# Patient Record
Sex: Female | Born: 1935 | Race: White | Hispanic: No | Smoking: Current every day smoker
Health system: Southern US, Community
[De-identification: ages and names within clinical notes are randomized; demographics above are authoritative.]

## PROBLEM LIST (undated history)

## (undated) DIAGNOSIS — D649 Anemia, unspecified: Secondary | ICD-10-CM

## (undated) DIAGNOSIS — G20A1 Parkinson's disease without dyskinesia, without mention of fluctuations: Secondary | ICD-10-CM

## (undated) DIAGNOSIS — G20 Parkinson's disease: Secondary | ICD-10-CM

---

## 1959-09-17 HISTORY — PX: APPENDECTOMY: SHX54

## 1972-09-16 HISTORY — PX: ABDOMINAL HYSTERECTOMY: SHX81

## 2000-09-16 HISTORY — PX: EYE SURGERY: SHX253

## 2014-12-06 ENCOUNTER — Ambulatory Visit: Payer: Self-pay | Admitting: Family Medicine

## 2015-12-27 ENCOUNTER — Other Ambulatory Visit: Payer: Self-pay | Admitting: Family Medicine

## 2015-12-27 DIAGNOSIS — T8484XD Pain due to internal orthopedic prosthetic devices, implants and grafts, subsequent encounter: Secondary | ICD-10-CM

## 2015-12-27 DIAGNOSIS — M25551 Pain in right hip: Secondary | ICD-10-CM

## 2016-01-10 ENCOUNTER — Other Ambulatory Visit: Payer: Self-pay | Admitting: Family Medicine

## 2016-01-10 ENCOUNTER — Ambulatory Visit
Admission: RE | Admit: 2016-01-10 | Discharge: 2016-01-10 | Disposition: A | Discharge status: Home / Self Care | Payer: Medicare Other | Source: Ambulatory Visit | Attending: Family Medicine | Admitting: Family Medicine

## 2016-01-10 DIAGNOSIS — Z96641 Presence of right artificial hip joint: Secondary | ICD-10-CM | POA: Insufficient documentation

## 2016-01-10 DIAGNOSIS — T8484XD Pain due to internal orthopedic prosthetic devices, implants and grafts, subsequent encounter: Secondary | ICD-10-CM

## 2016-01-10 DIAGNOSIS — M1612 Unilateral primary osteoarthritis, left hip: Secondary | ICD-10-CM | POA: Insufficient documentation

## 2016-01-10 DIAGNOSIS — K5730 Diverticulosis of large intestine without perforation or abscess without bleeding: Secondary | ICD-10-CM | POA: Diagnosis not present

## 2016-01-10 DIAGNOSIS — X58XXXD Exposure to other specified factors, subsequent encounter: Secondary | ICD-10-CM | POA: Diagnosis not present

## 2016-01-10 DIAGNOSIS — M25551 Pain in right hip: Secondary | ICD-10-CM | POA: Insufficient documentation

## 2016-01-17 ENCOUNTER — Ambulatory Visit: Payer: Self-pay

## 2016-04-23 ENCOUNTER — Other Ambulatory Visit: Payer: Self-pay | Admitting: Family Medicine

## 2016-04-23 ENCOUNTER — Ambulatory Visit
Admission: RE | Admit: 2016-04-23 | Discharge: 2016-04-23 | Disposition: A | Discharge status: Home / Self Care | Payer: Medicare Other | Source: Ambulatory Visit | Attending: Family Medicine | Admitting: Family Medicine

## 2016-04-23 DIAGNOSIS — J449 Chronic obstructive pulmonary disease, unspecified: Secondary | ICD-10-CM | POA: Insufficient documentation

## 2016-04-23 DIAGNOSIS — R0602 Shortness of breath: Secondary | ICD-10-CM

## 2016-08-26 ENCOUNTER — Emergency Department: Payer: Medicare Other

## 2016-08-26 ENCOUNTER — Encounter: Payer: Self-pay | Admitting: Emergency Medicine

## 2016-08-26 ENCOUNTER — Emergency Department
Admission: EM | Admit: 2016-08-26 | Discharge: 2016-08-26 | Disposition: A | Discharge status: Home / Self Care | Payer: Medicare Other | Attending: Emergency Medicine | Admitting: Emergency Medicine

## 2016-08-26 DIAGNOSIS — F17210 Nicotine dependence, cigarettes, uncomplicated: Secondary | ICD-10-CM | POA: Diagnosis not present

## 2016-08-26 DIAGNOSIS — W1800XA Striking against unspecified object with subsequent fall, initial encounter: Secondary | ICD-10-CM | POA: Insufficient documentation

## 2016-08-26 DIAGNOSIS — M25552 Pain in left hip: Secondary | ICD-10-CM | POA: Diagnosis not present

## 2016-08-26 DIAGNOSIS — Y999 Unspecified external cause status: Secondary | ICD-10-CM | POA: Diagnosis not present

## 2016-08-26 DIAGNOSIS — S0990XA Unspecified injury of head, initial encounter: Secondary | ICD-10-CM | POA: Diagnosis not present

## 2016-08-26 DIAGNOSIS — S79912A Unspecified injury of left hip, initial encounter: Secondary | ICD-10-CM | POA: Diagnosis present

## 2016-08-26 DIAGNOSIS — M25512 Pain in left shoulder: Secondary | ICD-10-CM

## 2016-08-26 DIAGNOSIS — Y92009 Unspecified place in unspecified non-institutional (private) residence as the place of occurrence of the external cause: Secondary | ICD-10-CM | POA: Diagnosis not present

## 2016-08-26 DIAGNOSIS — Y9389 Activity, other specified: Secondary | ICD-10-CM | POA: Diagnosis not present

## 2016-08-26 DIAGNOSIS — W19XXXA Unspecified fall, initial encounter: Secondary | ICD-10-CM

## 2016-08-26 DIAGNOSIS — G20 Parkinson's disease: Secondary | ICD-10-CM | POA: Diagnosis not present

## 2016-08-26 HISTORY — DX: Parkinson's disease: G20

## 2016-08-26 HISTORY — DX: Anemia, unspecified: D64.9

## 2016-08-26 HISTORY — DX: Parkinson's disease without dyskinesia, without mention of fluctuations: G20.A1

## 2016-08-26 MED ORDER — TRAMADOL HCL 50 MG PO TABS: 50.0000 mg | ORAL_TABLET | Freq: Four times a day (QID) | ORAL | 0 refills | Status: AC | PRN

## 2016-08-26 MED ORDER — MORPHINE SULFATE (PF) 4 MG/ML IV SOLN
2.0000 mg | Freq: Once | INTRAVENOUS | Status: AC
  Administered 2016-08-26: 2 mg via INTRAVENOUS
  Filled 2016-08-26: qty 1

## 2016-08-26 MED ORDER — LIDOCAINE 5 % EX PTCH: 1.0000 | MEDICATED_PATCH | Freq: Two times a day (BID) | CUTANEOUS | 0 refills | Status: AC

## 2016-08-26 MED ORDER — ACETAMINOPHEN 500 MG PO TABS
1000.0000 mg | ORAL_TABLET | Freq: Once | ORAL | Status: AC
  Administered 2016-08-26: 1000 mg via ORAL
  Filled 2016-08-26: qty 2

## 2016-08-26 MED ORDER — ACETAMINOPHEN 500 MG PO TABS: 1000.0000 mg | ORAL_TABLET | Freq: Three times a day (TID) | ORAL | 0 refills | Status: AC

## 2016-08-26 MED ORDER — DICLOFENAC SODIUM 1 % TD GEL: 2.0000 g | Freq: Four times a day (QID) | TRANSDERMAL | 0 refills | Status: AC

## 2016-08-26 NOTE — Discharge Instructions (Signed)
Pain control: Take tylenol 1000mg  every 8 hours. Take 50mg  of tramadol every 6 hours for breakthrough pain. Apply one lidoderm patch over the painful area every 24 hours. Also apply voltaren gel 4 times daily.

## 2016-08-26 NOTE — ED Notes (Signed)
Fitted for and supplied with walker.

## 2016-08-26 NOTE — ED Triage Notes (Signed)
Pt to ED via EMS from home c/o fall today around 5am.  Pt states leaned over to turn light off and lost her balance and fell.  States hit left side of head, left shoulder and left hip.  Pt able to walk some afterwards and took two 50mg  tramadol around 9am with little relief but pain becoming worse and can't move leg now.  Pt with shortening to left leg, has had previous right hip break which feels similar.  Pt presents A&Ox4, speaking in complete and coherent sentences, skin warm and dry, chest rise even and unlabored.

## 2016-08-26 NOTE — ED Notes (Signed)
E sign not working, inst to patient.

## 2016-08-26 NOTE — ED Notes (Signed)
Pt family out to desk asking if patient can have some of their drink. Told no, that pt was here for eval of hip fx and needs to be kept NPO. Family then asking if patient can have water, again informed that pt is to be kept NPO due to possible hip fx. Primary RN notified.

## 2016-08-26 NOTE — ED Provider Notes (Signed)
[facility] Emergency Department Provider Note  ____________________________________________  Time seen: Approximately 12:10 PM  I have reviewed the triage vital signs and the nursing notes.   HISTORY  Chief Complaint Fall and Hip Pain   HPI Janet Hampton is a 82 y.o. female a history of anemia and Parkinson's disease and presents for evaluation of left hip pain status post mechanical fall. Patient lives with her son and this morning was coming back from the bathroom when she leaned over to turn off the lights. She lost her balance and fell onto her left side. She is complaining of left shoulder pain, left hip pain, and a headache. She was able to ambulate after the fall and took a tramadol around 9 AM. However the pain has gotten worse on her left hip and she is now unable to bear weight. She is complaining of moderate pain that becomes severe with weightbearing or movement of the hip, sharp, constant, nonradiating. She denies headache, chest pain, palpitations, back pain, abdominal pain, dizziness both preceding and after the fall.  Past Medical History:  Diagnosis Date  . Anemia   . Parkinson's disease (HCC)     There are no active problems to display for this patient.   Past Surgical History:  Procedure Laterality Date  . ABDOMINAL HYSTERECTOMY  1974  . APPENDECTOMY  1961  . EYE SURGERY Bilateral 2002    Prior to Admission medications   Medication Sig Start Date End Date Taking? Authorizing Provider  acetaminophen (TYLENOL) 500 MG tablet Take 2 tablets (1,000 mg total) by mouth every 8 (eight) hours. 08/26/16 09/02/16  Nita Sicklearolina Cadell Gabrielson, MD  diclofenac sodium (VOLTAREN) 1 % GEL Apply 2 g topically 4 (four) times daily. 08/26/16   Nita Sicklearolina Niomi Valent, MD  lidocaine (LIDODERM) 5 % Place 1 patch onto the skin every 12 (twelve) hours. Remove & Discard patch within 12 hours or as directed by MD 08/26/16 08/26/17  Nita Sicklearolina Darrin Koman, MD  traMADol  (ULTRAM) 50 MG tablet Take 1 tablet (50 mg total) by mouth every 6 (six) hours as needed. 08/26/16 08/26/17  Nita Sicklearolina Domanick Cuccia, MD    Allergies Sulfa antibiotics  History reviewed. No pertinent family history.  Social History Social History  Substance Use Topics  . Smoking status: Current Every Day Smoker    Packs/day: 0.50    Types: Cigarettes  . Smokeless tobacco: Never Used  . Alcohol use No    Review of Systems Constitutional: Negative for fever. Eyes: Negative for visual changes. ENT: Negative for facial injury or neck injury Cardiovascular: Negative for chest injury. Respiratory: Negative for shortness of breath. Negative for chest wall injury. Gastrointestinal: Negative for abdominal pain or injury. Genitourinary: Negative for dysuria. Musculoskeletal: Negative for back injury. + left shoulder pain, + left hip pain Skin: Negative for laceration/abrasions. Neurological: + head injury.   ____________________________________________   PHYSICAL EXAM:  VITAL SIGNS: ED Triage Vitals  Enc Vitals Group     BP 08/26/16 1130 111/60     Pulse Rate 08/26/16 1130 90     Resp 08/26/16 1130 16     Temp 08/26/16 1130 98.6 F (37 C)     Temp Source 08/26/16 1130 Oral     SpO2 08/26/16 1130 91 %     Weight 08/26/16 1133 114 lb (51.7 kg)     Height 08/26/16 1133 5\' 6"  (1.676 m)     Head Circumference --      Peak Flow --      Pain Score  08/26/16 1133 9     Pain Loc --      Pain Edu? --      Excl. in GC? --     Constitutional: Alert and oriented. No acute distress. Does not appear intoxicated. HEENT Head: Normocephalic and atraumatic. Face: No facial bony tenderness. Stable midface Ears: No hemotympanum bilaterally. No Battle sign Eyes: No eye injury. PERRL. No raccoon eyes Nose: Nontender. No epistaxis. No rhinorrhea Mouth/Throat: Mucous membranes are moist. No oropharyngeal blood. No dental injury. Airway patent without stridor. Normal voice. Neck: no C-collar in  place. No midline c-spine tenderness.  Cardiovascular: Normal rate, regular rhythm. Normal and symmetric distal pulses are present in all extremities. Pulmonary/Chest: Chest wall is stable and nontender to palpation/compression. Normal respiratory effort. Breath sounds are normal. No crepitus.  Abdominal: Soft, nontender, non distended. Musculoskeletal: ttp over the lateral aspect of the left hip with significant limited ROM due to pain with internal and external rotation of the L hip. Nontender with normal full range of motion in all other extremities. No deformities. No thoracic or lumbar midline spinal tenderness. Pelvis is stable. Skin: Skin is warm, dry and intact. No abrasions or contutions. Psychiatric: Speech and behavior are appropriate. Neurological: Normal speech and language. Moves all extremities to command. No gross focal neurologic deficits are appreciated.  Glascow Coma Score: 4 - Opens eyes on own 6 - Follows simple motor commands 5 - Alert and oriented GCS: 15   ____________________________________________   LABS (all labs ordered are listed, but only abnormal results are displayed)  Labs Reviewed - No data to display ____________________________________________  EKG  none ____________________________________________  RADIOLOGY  Head CT: No evidence of acute intracranial abnormality. Atrophy with small vessel ischemic changes  XR Left shoulder: No acute abnormality. Osteopenia.  XR left hip: No acute abnormality. Moderately severe osteoarthritis of the left Hip.  CT left hip: . No acute osseous injury of the left hip. 2. Severe osteoarthritis of the left hip. ____________________________________________   PROCEDURES  Procedure(s) performed: None Procedures Critical Care performed:  None ____________________________________________   INITIAL IMPRESSION / ASSESSMENT AND PLAN / ED COURSE  82 y.o. female a history of anemia and Parkinson's disease  and presents for evaluation of left hip pain status post mechanical fall. Patient has significant decreased range of motion due to pain. Initial x-rays and no evidence of fracture. CT of the hip has been ordered to rule out occult fracture. Patient will be given morphine and Tylenol for the pain. No other injuries on exam or imaging.  Clinical Course as of Aug 27 1415  Mon Aug 26, 2016  1357 CT with no evidence of fracture. We'll attempt to ambulate patient at this time.  [CV]  1407 Patient able to ambulate, will dc home with standing tylenol and tramadol (patient refusing oxycodone as she says it makes her very confused and also she reports that tramadol was working at home), lidoderm patch, and voltaren gel. Patient will also be given a walker.   [CV]    Clinical Course User Index [CV] Nita Sickle, MD    Pertinent labs & imaging results that were available during my care of the patient were reviewed by me and considered in my medical decision making (see chart for details).    ____________________________________________   FINAL CLINICAL IMPRESSION(S) / ED DIAGNOSES  Final diagnoses:  Fall, initial encounter  Injury of head, initial encounter  Left hip pain  Acute pain of left shoulder      NEW MEDICATIONS  STARTED DURING THIS VISIT:  New Prescriptions   ACETAMINOPHEN (TYLENOL) 500 MG TABLET    Take 2 tablets (1,000 mg total) by mouth every 8 (eight) hours.   DICLOFENAC SODIUM (VOLTAREN) 1 % GEL    Apply 2 g topically 4 (four) times daily.   LIDOCAINE (LIDODERM) 5 %    Place 1 patch onto the skin every 12 (twelve) hours. Remove & Discard patch within 12 hours or as directed by MD   TRAMADOL (ULTRAM) 50 MG TABLET    Take 1 tablet (50 mg total) by mouth every 6 (six) hours as needed.     Note:  This document was prepared using Dragon voice recognition software and may include unintentional dictation errors.    Nita Sicklearolina Roan Sawchuk, MD 08/26/16 1416

## 2016-08-26 NOTE — ED Notes (Signed)
Patient transported to CT 

## 2018-02-24 IMAGING — CT CT HIP*L* W/O CM
2 of 6 series · 14 of 46 positions shown, 19 images · non-contrast
Comparison: None.

CLINICAL DATA: Status post fall, left hip pain

EXAM:
CT OF THE LEFT HIP WITHOUT CONTRAST
TECHNIQUE: Multidetector CT imaging of the left hip was performed according to
the standard protocol. Multiplanar CT image reconstructions were
also generated.

[Series 3: axial st · axial · 0.41mm/px · z∈[-1082,-928]mm · 11 of 89 slices shown, 16 images]
[im 6/89  soft-tissue]
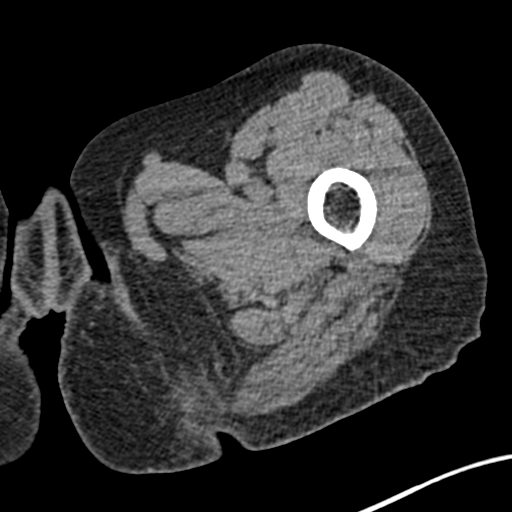
[im 6/89  bone]
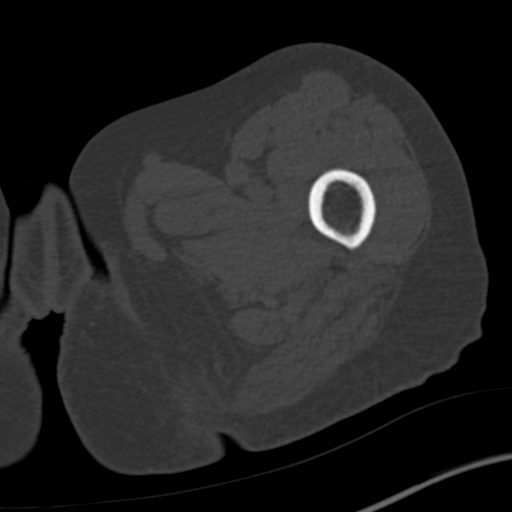
[im 18/89  soft-tissue]
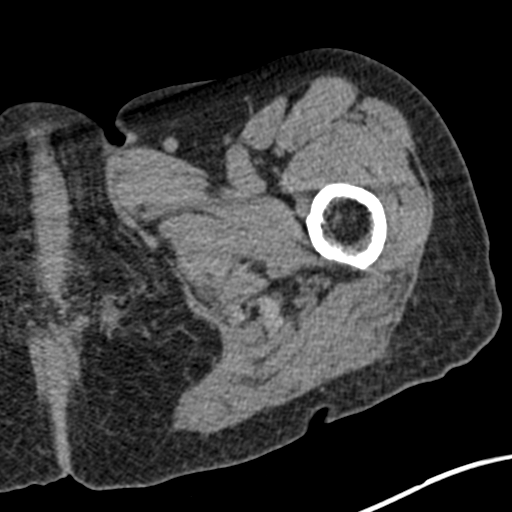
[im 24/89  soft-tissue]
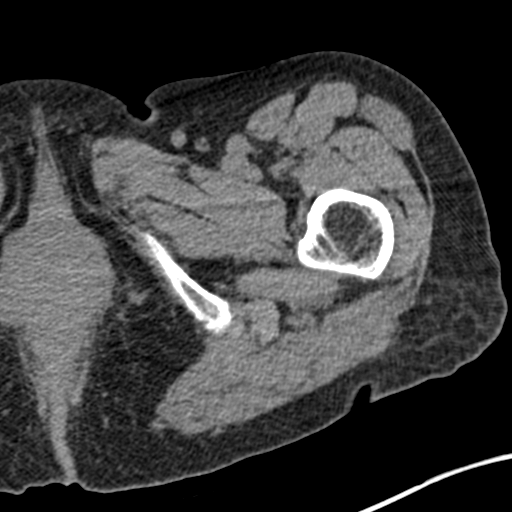
[im 30/89  soft-tissue]
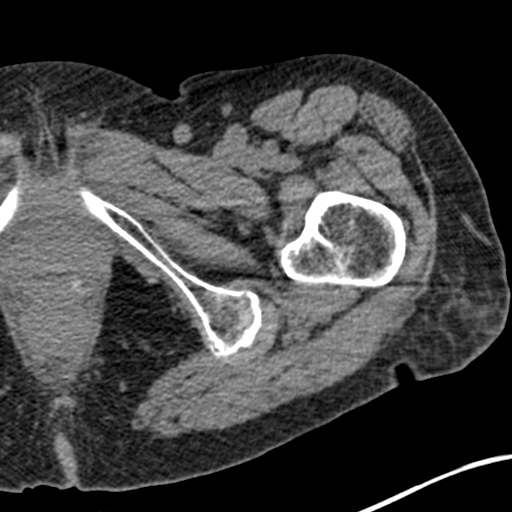
[im 42/89  soft-tissue]
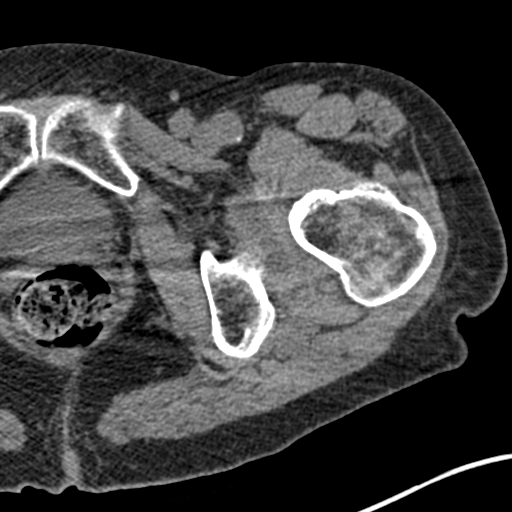
[im 47/89  soft-tissue]
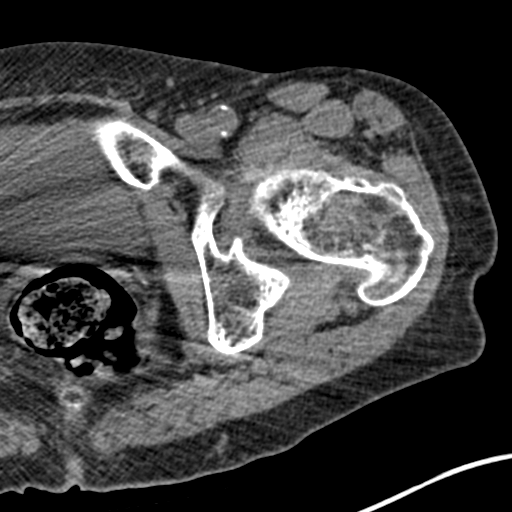
[im 59/89  soft-tissue]
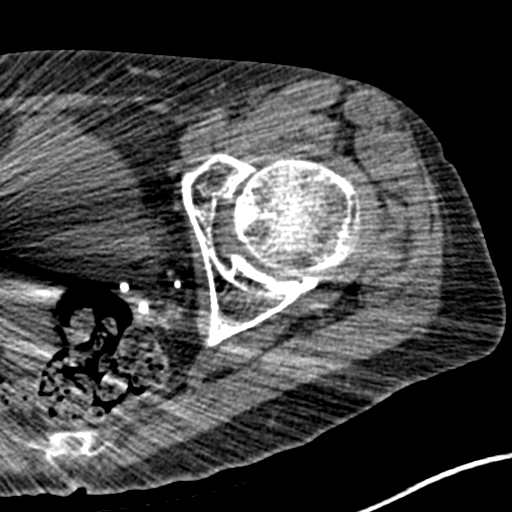
[im 65/89  soft-tissue]
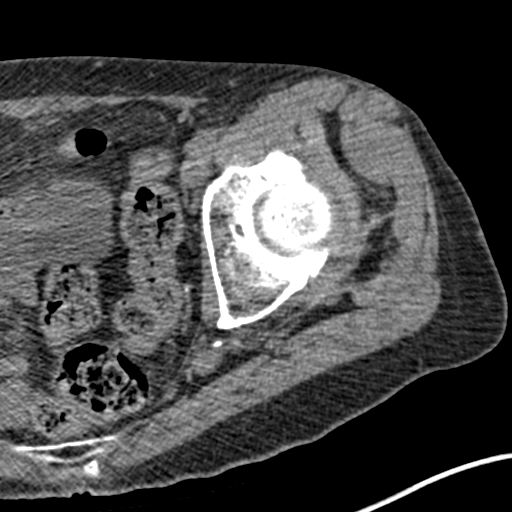
[im 65/89  lung]
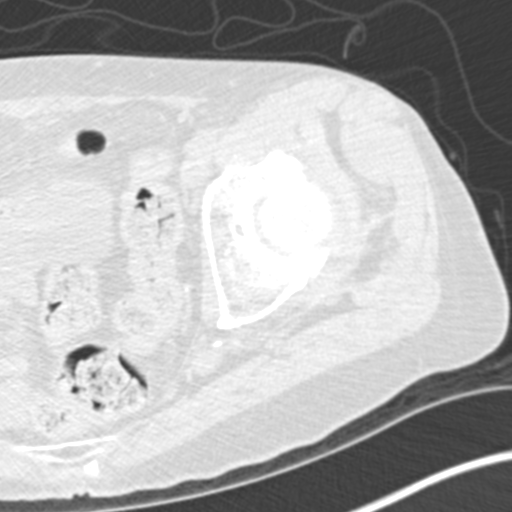
[im 71/89  soft-tissue]
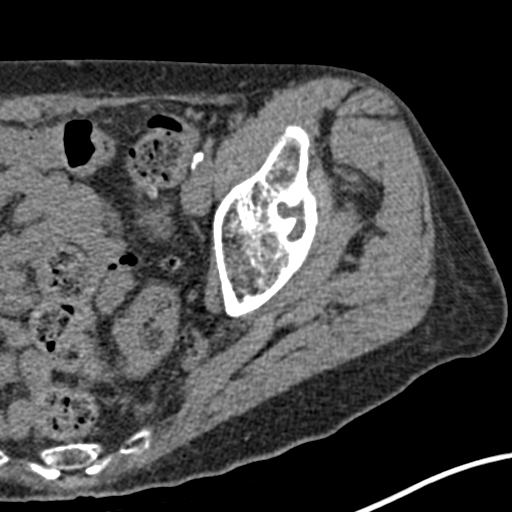
[im 71/89  lung]
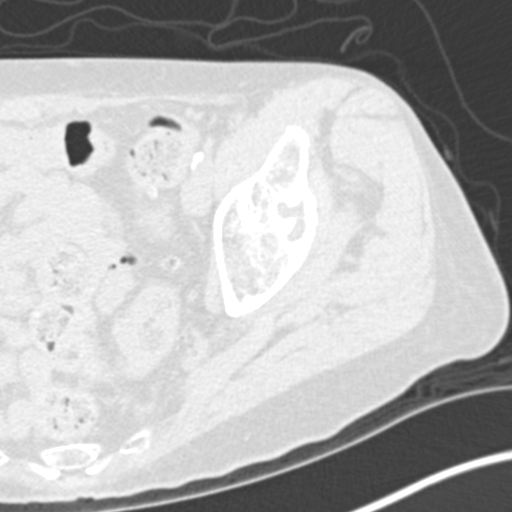
[im 71/89  bone]
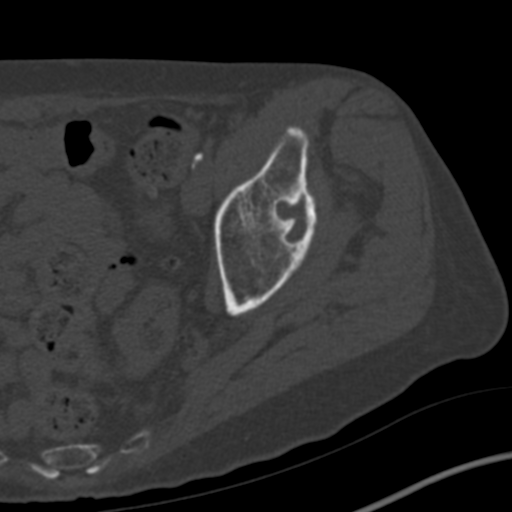
[im 77/89  lung]
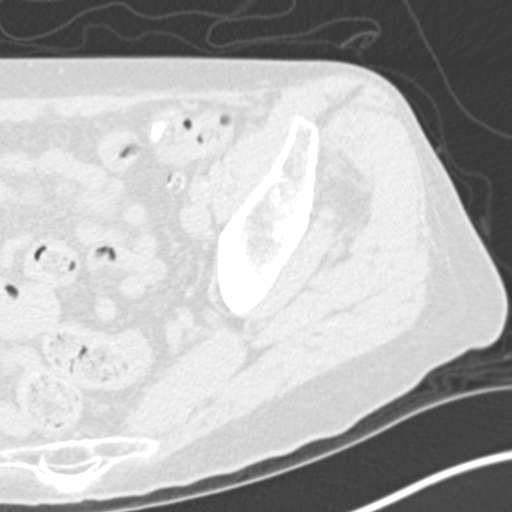
[im 83/89  soft-tissue]
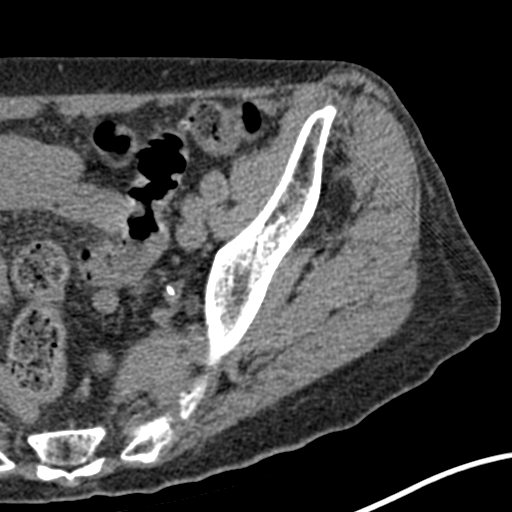
[im 83/89  lung]
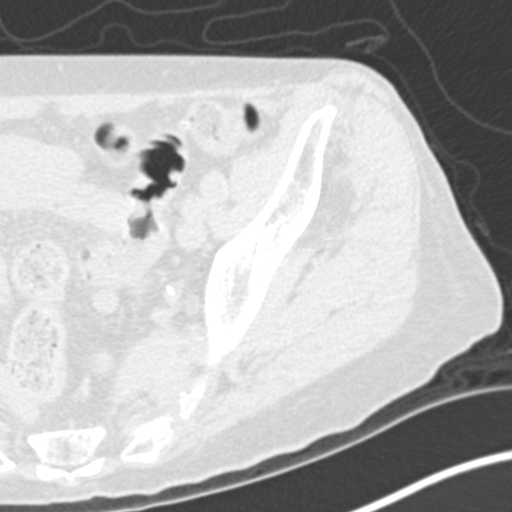

[Series 6: coronal st · coronal · 0.36mm/px · 3 of 91 slices shown]
[im 23/91  soft-tissue]
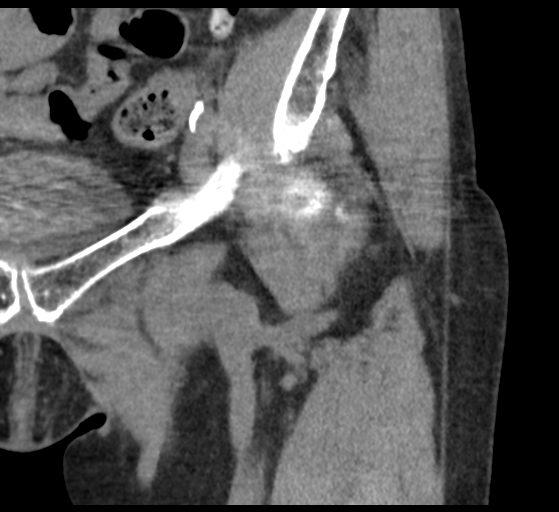
[im 46/91  soft-tissue]
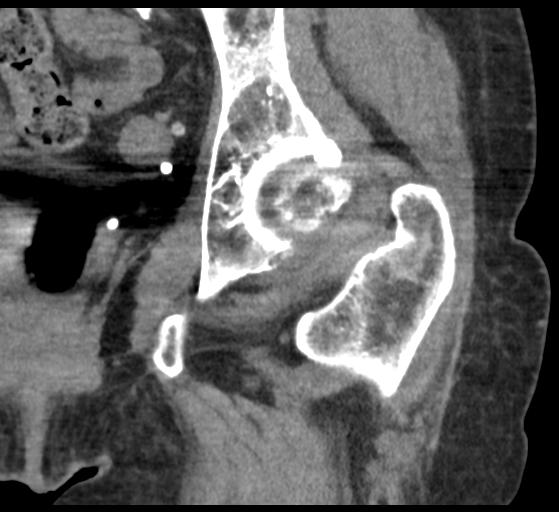
[im 68/91  soft-tissue]
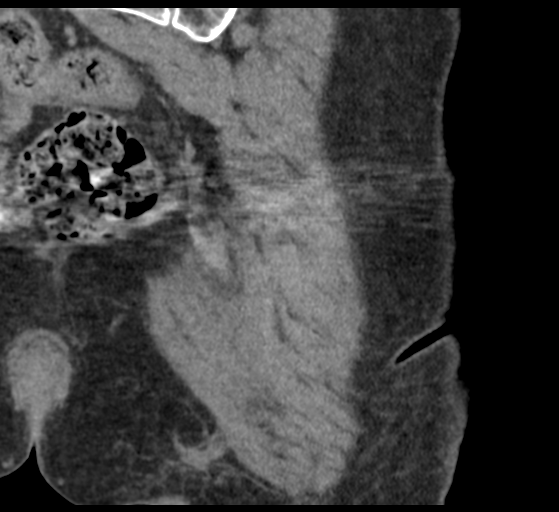

[14 of 46 positions shown; findings below may reference images not displayed]

FINDINGS: Bones/Joint/Cartilage

No acute fracture or dislocation. Severe osteoarthritis of the left
hip with superior joint space narrowing and subchondral cystic
changes in the acetabulum. Small marginal osteophytes.

No aggressive lytic or sclerotic lesion. Mild degenerative changes
of the visualize left SI joint.

Ligaments

Suboptimally assessed by CT.

Muscles and Tendons

Muscles are normal.  No muscle atrophy.

Soft tissues

No fluid collection or hematoma.
IMPRESSION: 1.  No acute osseous injury of the left hip.
2. Severe osteoarthritis of the left hip.

## 2018-02-24 IMAGING — CR DG SHOULDER 2+V*L*
3 series · 3 of 3 positions shown · non-contrast
Comparison: None.

CLINICAL DATA: Left shoulder pain secondary to a fall today.

EXAM:
LEFT SHOULDER - 2+ VIEW

[shoulder grashey]
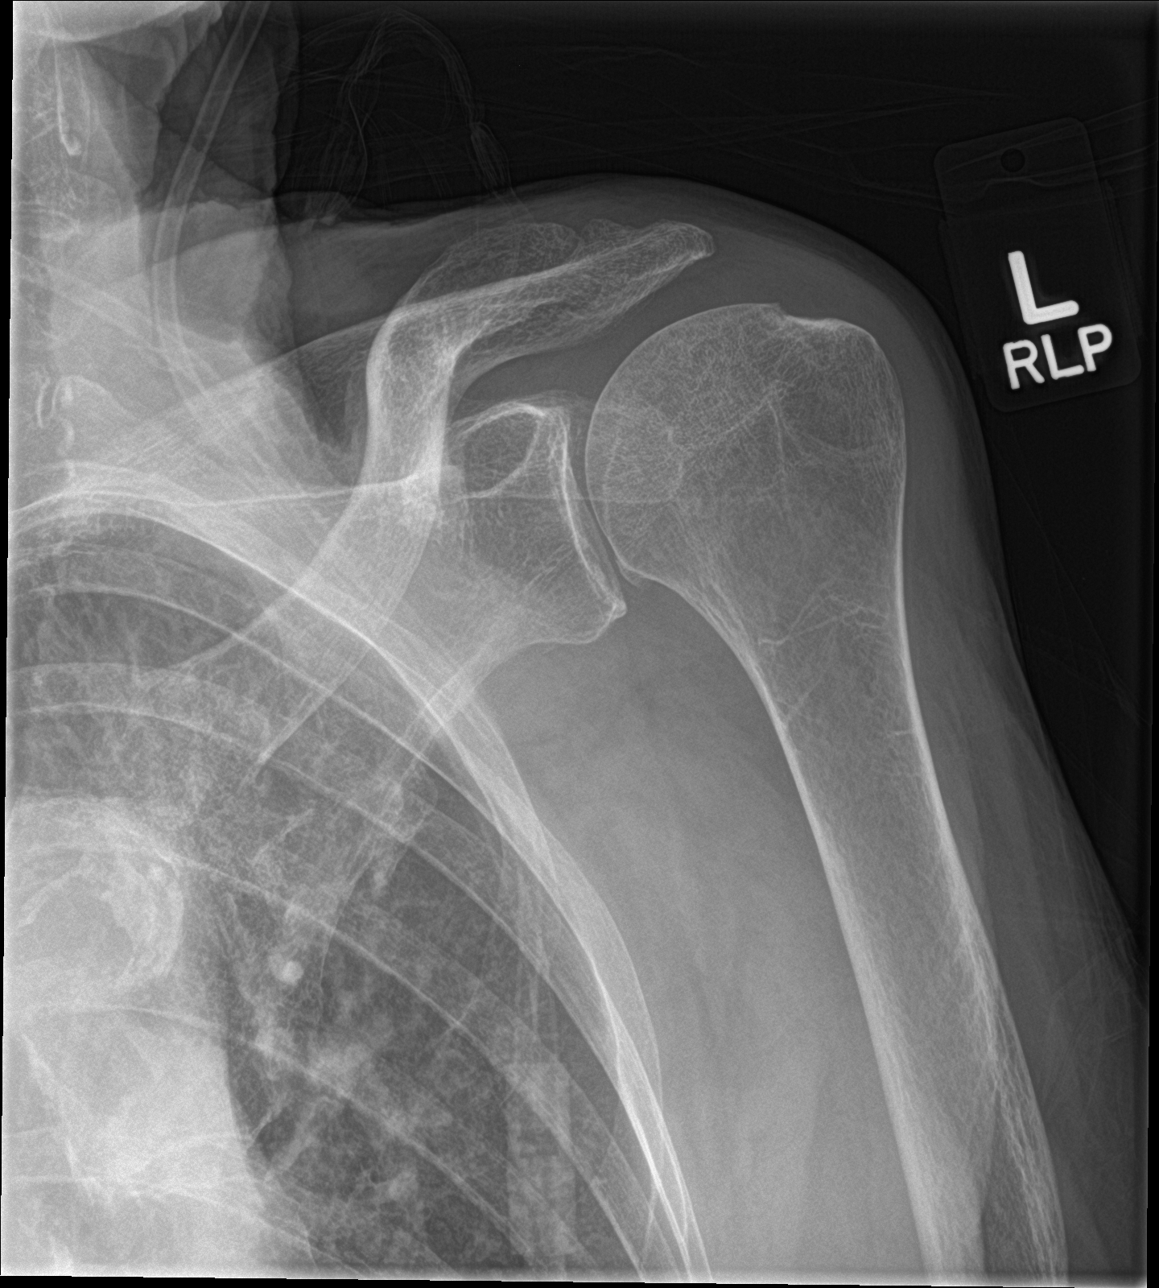

[shoulder y view]
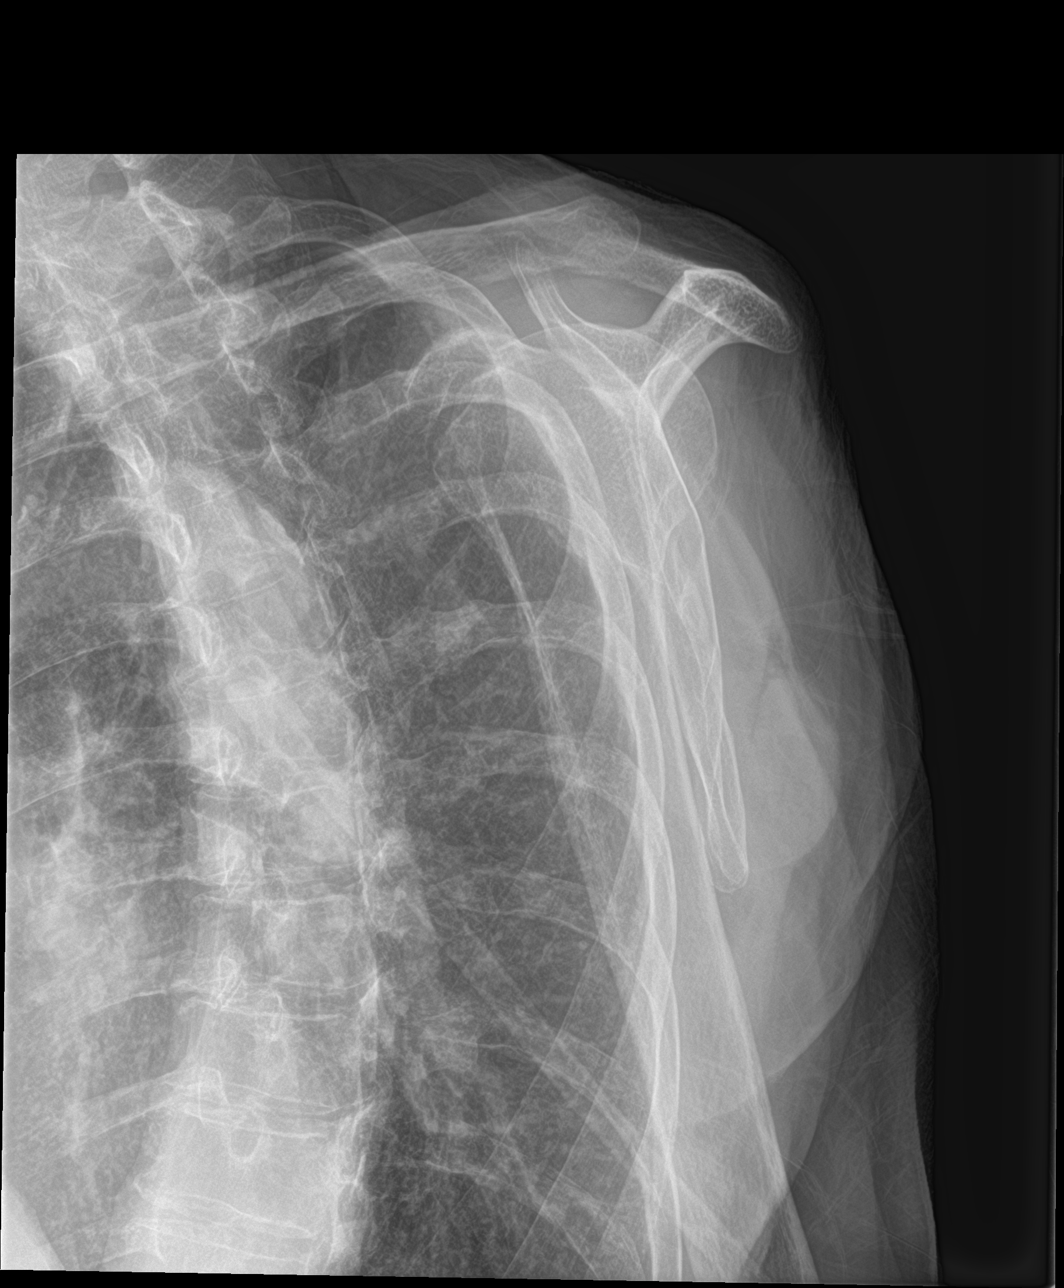

[shoulder ap neutral]
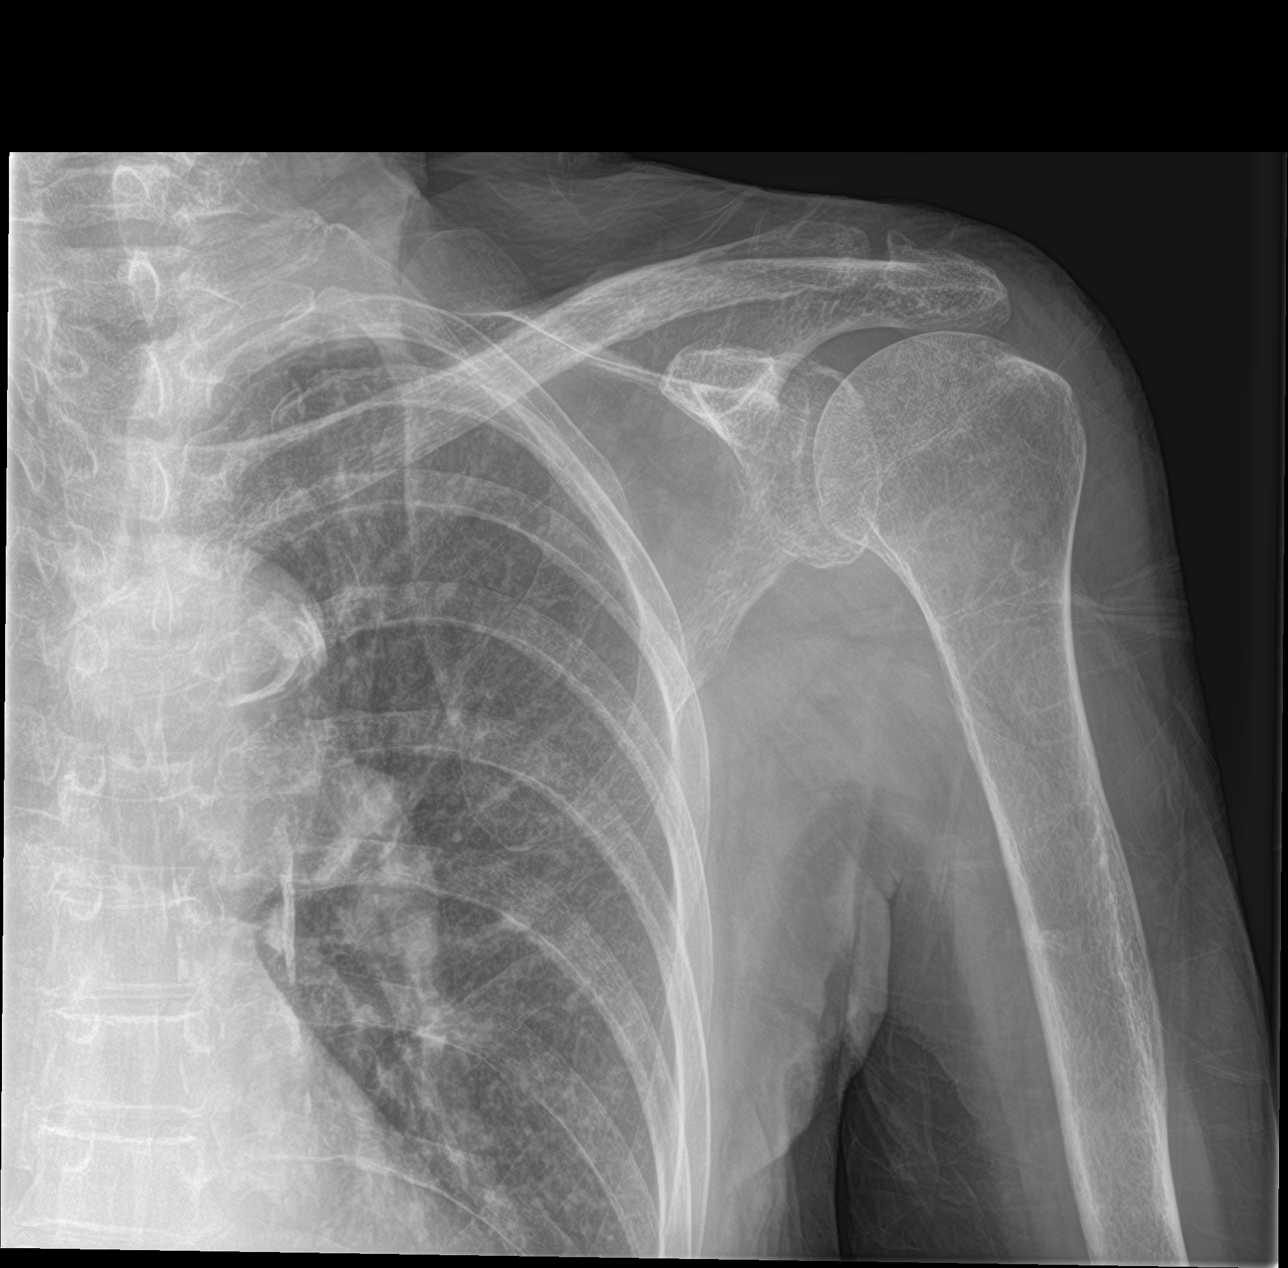

[3 of 3 positions shown; findings below may reference images not displayed]

FINDINGS: There is no fracture or dislocation. Minimal arthropathy of the
acromioclavicular joint. Osteopenia.

Aortic atherosclerosis.
IMPRESSION: No acute abnormality.  Osteopenia.

Aortic atherosclerosis.
